# Patient Record
Sex: Male | Born: 1990 | Race: White | Hispanic: No | Marital: Single | Smoking: Current every day smoker
Health system: Southern US, Community
[De-identification: ages and names within clinical notes are randomized; demographics above are authoritative.]

## PROBLEM LIST (undated history)

## (undated) DIAGNOSIS — F988 Other specified behavioral and emotional disorders with onset usually occurring in childhood and adolescence: Secondary | ICD-10-CM

## (undated) DIAGNOSIS — G43909 Migraine, unspecified, not intractable, without status migrainosus: Secondary | ICD-10-CM

## (undated) HISTORY — PX: HERNIA REPAIR: SHX51

---
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
## 2014-04-29 ENCOUNTER — Emergency Department: Payer: Self-pay | Admitting: Emergency Medicine

## 2014-05-05 ENCOUNTER — Emergency Department: Payer: Self-pay | Admitting: Emergency Medicine

## 2014-11-23 ENCOUNTER — Emergency Department
Admit: 2014-11-23 | Disposition: A | Discharge status: Home / Self Care | Payer: Self-pay | Admitting: Emergency Medicine

## 2015-04-15 ENCOUNTER — Encounter: Payer: Self-pay | Admitting: Emergency Medicine

## 2015-04-15 ENCOUNTER — Emergency Department
Admission: EM | Admit: 2015-04-15 | Discharge: 2015-04-15 | Disposition: A | Discharge status: Home / Self Care | Payer: BLUE CROSS/BLUE SHIELD | Attending: Emergency Medicine | Admitting: Emergency Medicine

## 2015-04-15 DIAGNOSIS — Z720 Tobacco use: Secondary | ICD-10-CM | POA: Insufficient documentation

## 2015-04-15 DIAGNOSIS — F329 Major depressive disorder, single episode, unspecified: Secondary | ICD-10-CM

## 2015-04-15 DIAGNOSIS — F32A Depression, unspecified: Secondary | ICD-10-CM

## 2015-04-15 DIAGNOSIS — Z79899 Other long term (current) drug therapy: Secondary | ICD-10-CM | POA: Insufficient documentation

## 2015-04-15 DIAGNOSIS — R45851 Suicidal ideations: Secondary | ICD-10-CM | POA: Diagnosis present

## 2015-04-15 DIAGNOSIS — F909 Attention-deficit hyperactivity disorder, unspecified type: Secondary | ICD-10-CM

## 2015-04-15 DIAGNOSIS — F331 Major depressive disorder, recurrent, moderate: Secondary | ICD-10-CM

## 2015-04-15 HISTORY — DX: Other specified behavioral and emotional disorders with onset usually occurring in childhood and adolescence: F98.8

## 2015-04-15 HISTORY — DX: Migraine, unspecified, not intractable, without status migrainosus: G43.909

## 2015-04-15 LAB — COMPREHENSIVE METABOLIC PANEL
ALT: 19 U/L (ref 17–63)
AST: 27 U/L (ref 15–41)
Albumin: 4.9 g/dL (ref 3.5–5.0)
Alkaline Phosphatase: 61 U/L (ref 38–126)
Anion gap: 7 (ref 5–15)
BUN: 16 mg/dL (ref 6–20)
CHLORIDE: 102 mmol/L (ref 101–111)
CO2: 31 mmol/L (ref 22–32)
CREATININE: 1.06 mg/dL (ref 0.61–1.24)
Calcium: 9.4 mg/dL (ref 8.9–10.3)
Glucose, Bld: 114 mg/dL — ABNORMAL HIGH (ref 65–99)
POTASSIUM: 4 mmol/L (ref 3.5–5.1)
Sodium: 140 mmol/L (ref 135–145)
Total Bilirubin: 0.7 mg/dL (ref 0.3–1.2)
Total Protein: 7.7 g/dL (ref 6.5–8.1)

## 2015-04-15 LAB — CBC
HCT: 45.1 % (ref 40.0–52.0)
Hemoglobin: 15.2 g/dL (ref 13.0–18.0)
MCH: 30.7 pg (ref 26.0–34.0)
MCHC: 33.7 g/dL (ref 32.0–36.0)
MCV: 91.2 fL (ref 80.0–100.0)
PLATELETS: 203 10*3/uL (ref 150–440)
RBC: 4.94 MIL/uL (ref 4.40–5.90)
RDW: 13 % (ref 11.5–14.5)
WBC: 13.6 10*3/uL — AB (ref 3.8–10.6)

## 2015-04-15 LAB — URINE DRUG SCREEN, QUALITATIVE (ARMC ONLY)
Amphetamines, Ur Screen: POSITIVE — AB
BENZODIAZEPINE, UR SCRN: NOT DETECTED
Barbiturates, Ur Screen: NOT DETECTED
CANNABINOID 50 NG, UR ~~LOC~~: POSITIVE — AB
Cocaine Metabolite,Ur ~~LOC~~: NOT DETECTED
MDMA (ECSTASY) UR SCREEN: NOT DETECTED
Methadone Scn, Ur: NOT DETECTED
Opiate, Ur Screen: NOT DETECTED
Phencyclidine (PCP) Ur S: NOT DETECTED
TRICYCLIC, UR SCREEN: NOT DETECTED

## 2015-04-15 LAB — SALICYLATE LEVEL

## 2015-04-15 LAB — ACETAMINOPHEN LEVEL: Acetaminophen (Tylenol), Serum: <10 ug/mL — ABNORMAL LOW (ref 10–30)

## 2015-04-15 LAB — ETHANOL

## 2015-04-15 MED ORDER — CITALOPRAM HYDROBROMIDE 20 MG PO TABS: 20.0000 mg | ORAL_TABLET | Freq: Every day | ORAL | Status: DC

## 2015-04-15 NOTE — ED Notes (Signed)
ED BHU PLACEMENT JUSTIFICATION Is the patient under IVC or is there intent for IVC: Yes.   Is the patient medically cleared: Yes.   Is there vacancy in the ED BHU: Yes.   Is the population mix appropriate for patient: Yes.   Is the patient awaiting placement in inpatient or outpatient setting: Yes.   Has the patient had a psychiatric consult: No. Survey of unit performed for contraband, proper placement and condition of furniture, tampering with fixtures in bathroom, shower, and each patient room: Yes.  ; Findings: none APPEARANCE/BEHAVIOR calm, cooperative and adequate rapport can be established NEURO ASSESSMENT Orientation: time, place and person Hallucinations: No.None noted (Hallucinations) Speech: Normal Gait: normal RESPIRATORY ASSESSMENT Normal expansion.  Clear to auscultation.  No rales, rhonchi, or wheezing., No chest wall tenderness., No kyphosis or scoliosis. CARDIOVASCULAR ASSESSMENT regular rate and rhythm, S1, S2 normal, no murmur, click, rub or gallop GASTROINTESTINAL ASSESSMENT soft, nontender, BS WNL, no r/g EXTREMITIES normal strength, tone, and muscle mass PLAN OF CARE Provide calm/safe environment. Vital signs assessed twice daily. ED BHU Assessment once each 12-hour shift. Collaborate with intake RN daily or as condition indicates. Assure the ED provider has rounded once each shift. Provide and encourage hygiene. Provide redirection as needed. Assess for escalating behavior; address immediately and inform ED provider.  Assess family dynamic and appropriateness for visitation as needed: No.; If necessary, describe findings: No family available at this time Educate the patient/family about BHU procedures/visitation: No.; If necessary, describe findings: None available at this time

## 2015-04-15 NOTE — ED Notes (Signed)
BEHAVIORAL HEALTH ROUNDING Patient sleeping: No. Patient alert and oriented: yes Behavior appropriate: Yes.  ; If no, describe:  Nutrition and fluids offered: Yes  Toileting and hygiene offered: Yes  Sitter present: q 15 min checks Law enforcement present: Yes  

## 2015-04-15 NOTE — ED Notes (Signed)
.  edb

## 2015-04-15 NOTE — Consult Note (Signed)
[facility] Face-to-Face Psychiatry Consult   Reason for Consult:  Consult for this 24 year old man with history of depression and ADHD who came to the emergency room voluntarily Referring Physician:  Karma Greaser Patient Identification: Stephen Jimenez MRN:  [id number] Principal Diagnosis: Major depression Diagnosis:   Patient Active Problem List   Diagnosis Date Noted  . Major depression [F32.2] 04/15/2015  . ADHD (attention deficit hyperactivity disorder) [F90.9] 04/15/2015    Total Time spent with patient: 1 hour  Subjective:   Stephen Jimenez is a 24 y.o. male patient admitted with "mom and I were bickering".  HPI:  Information from the patient and the chart. Last night the patient was in an argument with his mother. He says he doesn't even know what the argument was about but she told him that she was kicking him out of her house. At that point he says he lost his temper and started talking about how he wished he were going to die. His girlfriend convinced him to come over here to the hospital. The patient didn't actually do anything to hurt himself he just felt very stressed out and overwhelmed. Mood is been stressed out a lot recently. He worries a great deal about money. He sleeps poorly at night. Appetite is okay. Not having any psychotic symptoms. He has no past history of suicide attempts and had not recently been thinking about killing himself. He has been off of his Celexa for about 3 months now and not getting any outpatient psychiatric treatment. Admits that he smokes marijuana on a daily basis.  Past psychiatric history: No history of prior psychiatric hospitalizations. He has had past psychiatric treatment years ago. He has been receiving Adderall from his primary care doctor for ADHD which she says helps him to focus. No history of suicide attempts in the past. Has entertained thoughts of it previously when he has been upset.  Social history: Patient and his girlfriend had been living with  the patient's mother. Mother is throwing him out of the house for some reason. Patient thinks he can probably go stay with his father were some friends. He has been eating at [employer]. Has a lot of financial problems trouble getting enough money together to live independently. Says he has a good relationship with his fiance.  Medical history: No significant ongoing medical problems  Family history: Says his mother has major depression problems and his father has alcohol problems.  Current medication Adderall extended release 20 mg in the morning and plain Adderall 10 mg in the afternoon   HPI Elements:   Quality:  Depression and agitation and suicidal thoughts. Severity:  Moderate and transient. Timing:  Last night and this morning. Duration:  Several hours but resolving. Context:  Financial stress and stress of fighting with his mother.  Past Medical History:  Past Medical History  Diagnosis Date  . Migraines   . ADD (attention deficit disorder)     Past Surgical History  Procedure Laterality Date  . Hernia repair     Family History: History reviewed. No pertinent family history. Social History:  History  Alcohol Use  . Yes    Comment: occasional     History  Drug Use  . Yes  . Special: Marijuana    Social History   Social History  . Marital Status: Single    Spouse Name: N/A  . Number of Children: N/A  . Years of Education: N/A   Social History Main Topics  . Smoking status:  Current Every Day Smoker -- 1.00 packs/day    Types: Cigarettes  . Smokeless tobacco: None  . Alcohol Use: Yes     Comment: occasional  . Drug Use: Yes    Special: Marijuana  . Sexual Activity: Not Asked   Other Topics Concern  . None   Social History Narrative  . None   Additional Social History:                          Allergies:   Allergies  Allergen Reactions  . Benadryl [Diphenhydramine Hcl (Sleep)]   . Naproxen     Labs:  Results for orders placed  or performed during the hospital encounter of 04/15/15 (from the past 48 hour(s))  Comprehensive metabolic panel     Status: Abnormal   Collection Time: 04/15/15  4:19 AM  Result Value Ref Range   Sodium 140 135 - 145 mmol/L   Potassium 4.0 3.5 - 5.1 mmol/L   Chloride 102 101 - 111 mmol/L   CO2 31 22 - 32 mmol/L   Glucose, Bld 114 (H) 65 - 99 mg/dL   BUN 16 6 - 20 mg/dL   Creatinine, Ser 1.06 0.61 - 1.24 mg/dL   Calcium 9.4 8.9 - 10.3 mg/dL   Total Protein 7.7 6.5 - 8.1 g/dL   Albumin 4.9 3.5 - 5.0 g/dL   AST 27 15 - 41 U/L   ALT 19 17 - 63 U/L   Alkaline Phosphatase 61 38 - 126 U/L   Total Bilirubin 0.7 0.3 - 1.2 mg/dL   GFR calc non Af Amer >60 >60 mL/min   GFR calc Af Amer >60 >60 mL/min    Comment: (NOTE) The eGFR has been calculated using the CKD EPI equation. This calculation has not been validated in all clinical situations. eGFR's persistently <60 mL/min signify possible Chronic Kidney Disease.    Anion gap 7 5 - 15  Ethanol (ETOH)     Status: None   Collection Time: 04/15/15  4:19 AM  Result Value Ref Range   Alcohol, Ethyl (B) <5 <5 mg/dL    Comment:        LOWEST DETECTABLE LIMIT FOR SERUM ALCOHOL IS 5 mg/dL FOR MEDICAL PURPOSES ONLY   Salicylate level     Status: None   Collection Time: 04/15/15  4:19 AM  Result Value Ref Range   Salicylate Lvl <6.9 2.8 - 30.0 mg/dL  Acetaminophen level     Status: Abnormal   Collection Time: 04/15/15  4:19 AM  Result Value Ref Range   Acetaminophen (Tylenol), Serum <10 (L) 10 - 30 ug/mL    Comment:        THERAPEUTIC CONCENTRATIONS VARY SIGNIFICANTLY. A RANGE OF 10-30 ug/mL MAY BE AN EFFECTIVE CONCENTRATION FOR MANY PATIENTS. HOWEVER, SOME ARE BEST TREATED AT CONCENTRATIONS OUTSIDE THIS RANGE. ACETAMINOPHEN CONCENTRATIONS >150 ug/mL AT 4 HOURS AFTER INGESTION AND >50 ug/mL AT 12 HOURS AFTER INGESTION ARE OFTEN ASSOCIATED WITH TOXIC REACTIONS.   CBC     Status: Abnormal   Collection Time: 04/15/15  4:19 AM   Result Value Ref Range   WBC 13.6 (H) 3.8 - 10.6 K/uL   RBC 4.94 4.40 - 5.90 MIL/uL   Hemoglobin 15.2 13.0 - 18.0 g/dL   HCT 45.1 40.0 - 52.0 %   MCV 91.2 80.0 - 100.0 fL   MCH 30.7 26.0 - 34.0 pg   MCHC 33.7 32.0 - 36.0 g/dL   RDW 13.0 11.5 -  14.5 %   Platelets 203 150 - 440 K/uL  Urine Drug Screen, Qualitative ([facility] only)     Status: Abnormal   Collection Time: 04/15/15  7:27 AM  Result Value Ref Range   Tricyclic, Ur Screen NONE DETECTED NONE DETECTED   Amphetamines, Ur Screen POSITIVE (A) NONE DETECTED   MDMA (Ecstasy)Ur Screen NONE DETECTED NONE DETECTED   Cocaine Metabolite,Ur [place] NONE DETECTED NONE DETECTED   Opiate, Ur Screen NONE DETECTED NONE DETECTED   Phencyclidine (PCP) Ur S NONE DETECTED NONE DETECTED   Cannabinoid 50 Ng, Ur [place] POSITIVE (A) NONE DETECTED   Barbiturates, Ur Screen NONE DETECTED NONE DETECTED   Benzodiazepine, Ur Scrn NONE DETECTED NONE DETECTED   Methadone Scn, Ur NONE DETECTED NONE DETECTED    Comment: (NOTE) 425  Tricyclics, urine               Cutoff 1000 ng/mL 200  Amphetamines, urine             Cutoff 1000 ng/mL 300  MDMA (Ecstasy), urine           Cutoff 500 ng/mL 400  Cocaine Metabolite, urine       Cutoff 300 ng/mL 500  Opiate, urine                   Cutoff 300 ng/mL 600  Phencyclidine (PCP), urine      Cutoff 25 ng/mL 700  Cannabinoid, urine              Cutoff 50 ng/mL 800  Barbiturates, urine             Cutoff 200 ng/mL 900  Benzodiazepine, urine           Cutoff 200 ng/mL 1000 Methadone, urine                Cutoff 300 ng/mL 1100 1200 The urine drug screen provides only a preliminary, unconfirmed 1300 analytical test result and should not be used for non-medical 1400 purposes. Clinical consideration and professional judgment should 1500 be applied to any positive drug screen result due to possible 1600 interfering substances. A more specific alternate chemical method 1700 must be used in order to obtain a confirmed analytical  result.  1800 Gas chromato graphy / mass spectrometry (GC/MS) is the preferred 1900 confirmatory method.     Vitals: Blood pressure 109/67, pulse 92, temperature 97.9 F (36.6 C), temperature source Oral, resp. rate 16, height 5' 11"  (1.803 m), weight 58.968 kg (130 lb), SpO2 100 %.  Risk to Self: Is patient at risk for suicide?: No, but patient needs Medical Clearance Risk to Others:   Prior Inpatient Therapy:   Prior Outpatient Therapy:    No current facility-administered medications for this encounter.   Current Outpatient Prescriptions  Medication Sig Dispense Refill  . amphetamine-dextroamphetamine (ADDERALL XR) 20 MG 24 hr capsule Take 20 mg by mouth daily.    Marland Kitchen amphetamine-dextroamphetamine (ADDERALL) 10 MG tablet Take 10 mg by mouth daily with breakfast.    . citalopram (CELEXA) 20 MG tablet Take 1 tablet (20 mg total) by mouth daily. 30 tablet 0    Musculoskeletal: Strength & Muscle Tone: within normal limits Gait & Station: normal Patient leans: N/A  Psychiatric Specialty Exam: Physical Exam  Nursing note and vitals reviewed. Constitutional: He appears well-developed and well-nourished.  HENT:  Head: Normocephalic and atraumatic.  Eyes: Conjunctivae are normal. Pupils are equal, round, and reactive to light.  Neck: Normal range of motion.  Cardiovascular: Normal heart sounds.   Respiratory: Effort normal.  GI: Soft.  Musculoskeletal: Normal range of motion.  Neurological: He is alert.  Skin: Skin is warm and dry.  Psychiatric: His speech is normal and behavior is normal. Judgment and thought content normal. His mood appears anxious. Cognition and memory are normal.    Review of Systems  Constitutional: Negative.   HENT: Negative.   Eyes: Negative.   Respiratory: Negative.   Cardiovascular: Negative.   Gastrointestinal: Negative.   Musculoskeletal: Negative.   Skin: Negative.   Neurological: Negative.   Psychiatric/Behavioral: Positive for depression  and substance abuse. Negative for suicidal ideas and hallucinations. The patient is not nervous/anxious and does not have insomnia.     Blood pressure 109/67, pulse 92, temperature 97.9 F (36.6 C), temperature source Oral, resp. rate 16, height 5' 11"  (1.803 m), weight 58.968 kg (130 lb), SpO2 100 %.Body mass index is 18.14 kg/(m^2).  General Appearance: Casual  Eye Contact::  Fair  Speech:  Normal Rate  Volume:  Normal  Mood:  Depressed  Affect:  Depressed  Thought Process:  Linear  Orientation:  Full (Time, Place, and Person)  Thought Content:  Negative  Suicidal Thoughts:  No  Homicidal Thoughts:  No  Memory:  Immediate;   Fair Recent;   Fair Remote;   Fair  Judgement:  Intact  Insight:  Fair  Psychomotor Activity:  Normal  Concentration:  Fair  Recall:  [employer] of Knowledge:Good  Language: Good  Akathisia:  No  Handed:  Right  AIMS (if indicated):     Assets:  Communication Skills Desire for Improvement Physical Health  ADL's:  Intact  Cognition: WNL  Sleep:      Medical Decision Making: Review of Psycho-Social Stressors (1), Review or order clinical lab tests (1), Review or order medicine tests (1) and Review of Medication Regimen & Side Effects (2)  Treatment Plan Summary: Medication management and Plan Patient will be started back on citalopram 20 mg a day. Supportive and educational counseling completed. Patient is no longer reporting suicidal ideation and has a positive plan for the future. Agrees to follow up with [facility]. Case discussed with emergency room attending. Patient is encouraged to discontinue his regular use of marijuana and to avoid alcohol. He can be released from the emergency room and will follow-up as an outpatient.  Plan:  Patient does not meet criteria for psychiatric inpatient admission. Supportive therapy provided about ongoing stressors. Disposition: Follow up as an outpatient after discontinuing the involuntary commitment  Alethia Berthold 04/15/2015 6:33 PM

## 2015-04-15 NOTE — ED Notes (Signed)
BEHAVIORAL HEALTH ROUNDING Patient sleeping: No. Patient alert and oriented: yes Behavior appropriate: Yes.   Nutrition and fluids offered: Yes  Toileting and hygiene offered: Yes  Sitter present: q15 min observations Law enforcement present: Yes [employer] 

## 2015-04-15 NOTE — ED Notes (Signed)
Patient verbalized understanding of need to return to ED with any thoughts of hurting himself or anyone else.

## 2015-04-15 NOTE — ED Notes (Signed)
Patient assigned to appropriate care area. Patient oriented to unit/care area: Informed that, for their safety, care areas are designed for safety and monitored by security cameras at all times; and visiting hours explained to patient. Patient verbalizes understanding, and verbal contract for safety obtained. 

## 2015-04-15 NOTE — ED Notes (Signed)

## 2015-04-15 NOTE — ED Provider Notes (Signed)
-----------------------------------------   6:23 PM on 04/15/2015 -----------------------------------------   BP 103/77 mmHg  Pulse 80  Temp(Src) 98.3 F (36.8 C) (Oral)  Resp 18  Ht  (1.803 m)  Wt 130 lb (58.968 kg)  BMI 18.14 kg/m2  SpO2 98%  I spoke in person with Dr. Toni Amend who evaluated the patient and feels that the patient is safe to be discharged with outpatient follow-up.  The patient is hemodynamically stable and appropriate to go at this time.   Loleta Rose, MD 04/15/15 [phone]

## 2015-04-15 NOTE — ED Notes (Signed)
BEHAVIORAL HEALTH ROUNDING Patient sleeping: Yes.   Patient alert and oriented: sleeping Behavior appropriate: Yes.  ; If no, describe:  Nutrition and fluids offered: Yes  Toileting and hygiene offered: Yes  Sitter present: q 15 min checks Law enforcement present: Yes  

## 2015-04-15 NOTE — ED Provider Notes (Signed)
[facility] Emergency Department Provider Note  ____________________________________________  Time seen: 5:30 AM  I have reviewed the triage vital signs and the nursing notes.   HISTORY  Chief Complaint Mental Health Problem     HPI Stephen Jimenez is a 24 y.o. male presents with suicidal ideation 2 days. Patient also voices feelings of depression. Patient denies any previous suicide attempts or psychiatric hospitalizations. Patient states that he is being having verbal altercations with his mother with whom he lives.     Past Medical History  Diagnosis Date  . Migraines   . ADD (attention deficit disorder)     There are no active problems to display for this patient.   Past Surgical History  Procedure Laterality Date  . Hernia repair      Current Outpatient Rx  Name  Route  Sig  Dispense  Refill  . amphetamine-dextroamphetamine (ADDERALL XR) 20 MG 24 hr capsule   Oral   Take 20 mg by mouth daily.         Marland Kitchen amphetamine-dextroamphetamine (ADDERALL) 10 MG tablet   Oral   Take 10 mg by mouth daily with breakfast.           Allergies Benadryl and Naproxen  History reviewed. No pertinent family history.  Social History Social History  Substance Use Topics  . Smoking status: Current Every Day Smoker -- 1.00 packs/day    Types: Cigarettes  . Smokeless tobacco: None  . Alcohol Use: Yes     Comment: occasional    Review of Systems  Constitutional: Negative for fever. Eyes: Negative for visual changes. ENT: Negative for sore throat. Cardiovascular: Negative for chest pain. Respiratory: Negative for shortness of breath. Gastrointestinal: Negative for abdominal pain, vomiting and diarrhea. Genitourinary: Negative for dysuria. Musculoskeletal: Negative for back pain. Skin: Negative for rash. Neurological: Negative for headaches, focal weakness or numbness. Psychiatric: Depression and suicidal ideation  10-point ROS otherwise  negative.  ____________________________________________   PHYSICAL EXAM:  VITAL SIGNS: ED Triage Vitals  Enc Vitals Group     BP 04/15/15 0412 135/83 mmHg     Pulse Rate 04/15/15 0412 95     Resp 04/15/15 0412 20     Temp 04/15/15 0412 98 F (36.7 C)     Temp Source 04/15/15 0412 Oral     SpO2 04/15/15 0412 98 %     Weight 04/15/15 0412 130 lb (58.968 kg)     Height 04/15/15 0412 5\' 11"  (1.803 m)     Head Cir --      Peak Flow --      Pain Score --      Pain Loc --      Pain Edu? --      Excl. in GC? --      Constitutional: Alert and oriented. Well appearing and in no distress. Eyes: Conjunctivae are normal. PERRL. Normal extraocular movements. ENT   Head: Normocephalic and atraumatic.   Nose: No congestion/rhinnorhea.   Mouth/Throat: Mucous membranes are moist.   Neck: No stridor. Hematological/Lymphatic/Immunilogical: No cervical lymphadenopathy. Cardiovascular: Normal rate, regular rhythm. Normal and symmetric distal pulses are present in all extremities. No murmurs, rubs, or gallops. Respiratory: Normal respiratory effort without tachypnea nor retractions. Breath sounds are clear and equal bilaterally. No wheezes/rales/rhonchi. Gastrointestinal: Soft and nontender. No distention. There is no CVA tenderness. Genitourinary: deferred Musculoskeletal: Nontender with normal range of motion in all extremities. No joint effusions.  No lower extremity tenderness nor edema. Neurologic:  Normal speech and  language. No gross focal neurologic deficits are appreciated. Speech is normal.  Skin:  Skin is warm, dry and intact. No rash noted. Psychiatric: Mood and affect are normal. Speech and behavior are normal. Patient exhibits appropriate insight and judgment.  ____________________________________________    LABS (pertinent positives/negatives)  Labs Reviewed  COMPREHENSIVE METABOLIC PANEL - Abnormal; Notable for the following:    Glucose, Bld 114 (*)    All  other components within normal limits  ACETAMINOPHEN LEVEL - Abnormal; Notable for the following:    Acetaminophen (Tylenol), Serum <10 (*)    All other components within normal limits  CBC - Abnormal; Notable for the following:    WBC 13.6 (*)    All other components within normal limits  ETHANOL  SALICYLATE LEVEL  URINE DRUG SCREEN, QUALITATIVE ([facility] ONLY)          INITIAL IMPRESSION / ASSESSMENT AND PLAN / ED COURSE  Pertinent labs & imaging results that were available during my care of the patient were reviewed by me and considered in my medical decision making (see chart for details).  Given suicidal ideation will involuntary commit the patient while he awaits psychiatric evaluation.  ____________________________________________   FINAL CLINICAL IMPRESSION(S) / ED DIAGNOSES  Final diagnoses:  Depression      Darci Current, MD 04/17/15 [phone]

## 2015-04-15 NOTE — ED Provider Notes (Signed)
-----------------------------------------   10:46 AM on 04/15/2015 -----------------------------------------   Blood pressure 103/77, pulse 80, temperature 98.3 F (36.8 C), temperature source Oral, resp. rate 18, height  (1.803 m), weight 130 lb (58.968 kg), SpO2 98 %.  The patient had no acute events since last update.  Calm and cooperative at this time.  Disposition is pending per Psychiatry/Behavioral Medicine team recommendations.     Arnaldo Natal, MD 04/15/15 [phone]

## 2015-04-15 NOTE — ED Notes (Signed)
BEHAVIORAL HEALTH ROUNDING Patient sleeping: No. Patient alert and oriented: yes Behavior appropriate: Yes.  ; If no, describe:  Nutrition and fluids offered: Yes  Toileting and hygiene offered: Yes  Sitter present: yes Law enforcement present: Yes  

## 2015-04-15 NOTE — ED Notes (Signed)

## 2015-04-15 NOTE — ED Notes (Addendum)
Pt reports coming to the ER voluntarily because "I have been having thoughts about committing suicide". When asked about a specific plan pt stated " I know there are multiple ways, but I don'y have any way decided".  Pt reports having moved into Mother's home with fiance about 6 months ago. Pt reports that lately the srtess has gotten "really bad, and all my Mom and I do is fight all the time". Pt denies fighting with fiance. Pt does report infrequent ETOH use, and almost daily marijuana use. Pt denies HI, A/V H. Pt reports thinking about suicide previously, but has "not attempted" it before.  Pt reports hx of ADD with medication of Adderall  total.

## 2015-04-15 NOTE — Discharge Instructions (Signed)
You have been seen in the Emergency Department (ED) today for a psychiatric complaint.  You have been evaluated by psychiatry and we believe you are safe to be discharged from the hospital.   ° °Please return to the ED immediately if you have ANY thoughts of hurting yourself or anyone else, so that we may help you. ° °Please avoid alcohol and drug use. ° °Follow up with your doctor and/or therapist as soon as possible regarding today's ED visit.   Please follow up any other recommendations and clinic appointments provided by the psychiatry team that saw you in the Emergency Department. ° ° °Depression °Depression refers to feeling sad, low, down in the dumps, blue, gloomy, or empty. In general, there are two kinds of depression: °1. Normal sadness or normal grief. This kind of depression is one that we all feel from time to time after upsetting life experiences, such as the loss of a job or the ending of a relationship. This kind of depression is considered normal, is short lived, and resolves within a few days to 2 weeks. Depression experienced after the loss of a loved one (bereavement) often lasts longer than 2 weeks but normally gets better with time. °2. Clinical depression. This kind of depression lasts longer than normal sadness or normal grief or interferes with your ability to function at home, at work, and in school. It also interferes with your personal relationships. It affects almost every aspect of your life. Clinical depression is an illness. °Symptoms of depression can also be caused by conditions other than those mentioned above, such as: °· Physical illness. Some physical illnesses, including underactive thyroid gland (hypothyroidism), severe anemia, specific types of cancer, diabetes, uncontrolled seizures, heart and lung problems, strokes, and chronic pain are commonly associated with symptoms of depression. °· Side effects of some prescription medicine. In some people, certain types of medicine  can cause symptoms of depression. °· Substance abuse. Abuse of alcohol and illicit drugs can cause symptoms of depression. °SYMPTOMS °Symptoms of normal sadness and normal grief include the following: °· Feeling sad or crying for short periods of time. °· Not caring about anything (apathy). °· Difficulty sleeping or sleeping too much. °· No longer able to enjoy the things you used to enjoy. °· Desire to be by oneself all the time (social isolation). °· Lack of energy or motivation. °· Difficulty concentrating or remembering. °· Change in appetite or weight. °· Restlessness or agitation. °Symptoms of clinical depression include the same symptoms of normal sadness or normal grief and also the following symptoms: °· Feeling sad or crying all the time. °· Feelings of guilt or worthlessness. °· Feelings of hopelessness or helplessness. °· Thoughts of suicide or the desire to harm yourself (suicidal ideation). °· Loss of touch with reality (psychotic symptoms). Seeing or hearing things that are not real (hallucinations) or having false beliefs about your life or the people around you (delusions and paranoia). °DIAGNOSIS  °The diagnosis of clinical depression is usually based on how bad the symptoms are and how [place] they have lasted. Your health care provider will also ask you questions about your medical history and substance use to find out if physical illness, use of prescription medicine, or substance abuse is causing your depression. Your health care provider may also order blood tests. °TREATMENT  °Often, normal sadness and normal grief do not require treatment. However, sometimes antidepressant medicine is given for bereavement to ease the depressive symptoms until they resolve. °The treatment for clinical depression   depends on how bad the symptoms are but often includes antidepressant medicine, counseling with a mental health professional, or both. Your health care provider will help to determine what treatment is  best for you. °Depression caused by physical illness usually goes away with appropriate medical treatment of the illness. If prescription medicine is causing depression, talk with your health care provider about stopping the medicine, decreasing the dose, or changing to another medicine. °Depression caused by the abuse of alcohol or illicit drugs goes away when you stop using these substances. Some adults need professional help in order to stop drinking or using drugs. °SEEK IMMEDIATE MEDICAL CARE IF: °· You have thoughts about hurting yourself or others. °· You lose touch with reality (have psychotic symptoms). °· You are taking medicine for depression and have a serious side effect. °FOR MORE INFORMATION °· National Alliance on Mental Illness: www.nami.org  °· National Institute of Mental Health: www.nimh.nih.gov  °Document Released: 07/29/2000 Document Revised: 12/16/2013 Document Reviewed: 10/31/2011 °ExitCare® Patient Information ©2015 [employer]. This information is not intended to replace advice given to you by your health care provider. Make sure you discuss any questions you have with your health care provider. ° °

## 2015-04-15 NOTE — ED Notes (Signed)
Pt given supper tray.

## 2015-04-15 NOTE — ED Notes (Signed)
Patient ambulatory to triage with steady gait, without difficulty, pt tearful, states, "I really wanted to kill myself tonight, a lot of stress"; st was kicked of his mother's house tonight and has nowhere to go; pt reports hx depression but not on any meds since 2009

## 2016-09-24 ENCOUNTER — Emergency Department
Admission: EM | Admit: 2016-09-24 | Discharge: 2016-09-24 | Disposition: A | Discharge status: Home / Self Care | Payer: BLUE CROSS/BLUE SHIELD | Attending: Emergency Medicine | Admitting: Emergency Medicine

## 2016-09-24 ENCOUNTER — Encounter: Payer: Self-pay | Admitting: Emergency Medicine

## 2016-09-24 DIAGNOSIS — J111 Influenza due to unidentified influenza virus with other respiratory manifestations: Secondary | ICD-10-CM | POA: Insufficient documentation

## 2016-09-24 DIAGNOSIS — F17210 Nicotine dependence, cigarettes, uncomplicated: Secondary | ICD-10-CM | POA: Diagnosis not present

## 2016-09-24 DIAGNOSIS — F909 Attention-deficit hyperactivity disorder, unspecified type: Secondary | ICD-10-CM | POA: Insufficient documentation

## 2016-09-24 DIAGNOSIS — R509 Fever, unspecified: Secondary | ICD-10-CM | POA: Diagnosis present

## 2016-09-24 NOTE — Discharge Instructions (Signed)
Your symptoms are consistent with influenza. Continue to monitor and treat symptoms including fevers, cough, and congestion. Drink plenty of fluids to prevent dehydration. Follow-up with your provider or return to the ED for continued symptoms.

## 2016-09-24 NOTE — ED Provider Notes (Signed)
[facility] Emergency Department Provider Note ____________________________________________  Time seen: 1739  I have reviewed the triage vital signs and the nursing notes.  HISTORY  Chief Complaint  Cough; Nasal Congestion; and Fever  HPI Stephen Jimenez is a 26 y.o. male resistance to the ED for evaluation of flulike symptoms for the last 3 days. Patient describes his mom was diagnosed today with influenza and pneumonia. He did not receive seasonal flu vaccine. Has been sick however for 3 days prior to the fever onset. He has been taking Zofran for nausea, and he is not incontinent take any prescribed medications for influenza.He presents here to the ED at the encouragement of his mother.   Past Medical History:  Diagnosis Date  . ADD (attention deficit disorder)   . Migraines     Patient Active Problem List   Diagnosis Date Noted  . Major depression 04/15/2015  . ADHD (attention deficit hyperactivity disorder) 04/15/2015    Past Surgical History:  Procedure Laterality Date  . HERNIA REPAIR      Prior to Admission medications   Medication Sig Start Date End Date Taking? Authorizing Provider  amphetamine-dextroamphetamine (ADDERALL XR) 20 MG 24 hr capsule Take 20 mg by mouth daily.    Historical Provider, MD  amphetamine-dextroamphetamine (ADDERALL) 10 MG tablet Take 10 mg by mouth daily with breakfast.    Historical Provider, MD  citalopram (CELEXA) 20 MG tablet Take 1 tablet (20 mg total) by mouth daily. 04/15/15   Audery AmelJohn T Clapacs, MD    Allergies Benadryl [diphenhydramine hcl (sleep)] and Naproxen  History reviewed. No pertinent family history.  Social History Social History  Substance Use Topics  . Smoking status: Current Every Day Smoker    Packs/day: 1.00    Types: Cigarettes  . Smokeless tobacco: Never Used  . Alcohol use Yes     Comment: occasional    Review of Systems  Constitutional: Positive for fever. Eyes: Negative for visual  changes. ENT: Negative for sore throat. Cardiovascular: Negative for chest pain. Respiratory: Negative for shortness of breath. Gastrointestinal: Negative for abdominal pain, vomiting and diarrhea. Reports nausea.  Genitourinary: Negative for dysuria. Musculoskeletal: Negative for back pain. Skin: Negative for rash. Neurological: Negative for headaches, focal weakness or numbness. ____________________________________________  PHYSICAL EXAM:  VITAL SIGNS: ED Triage Vitals  Enc Vitals Group     BP 09/24/16 1555 124/78     Pulse Rate 09/24/16 1555 71     Resp 09/24/16 1555 20     Temp 09/24/16 1555 98.6 F (37 C)     Temp Source 09/24/16 1555 Oral     SpO2 09/24/16 1555 100 %     Weight 09/24/16 1555 130 lb (59 kg)     Height --      Head Circumference --      Peak Flow --      Pain Score 09/24/16 1556 4     Pain Loc --      Pain Edu? --      Excl. in GC? --    Constitutional: Alert and oriented. Well appearing and in no distress. Head: Normocephalic and atraumatic. Eyes: Conjunctivae are normal. PERRL. Normal extraocular movements Ears: Canals clear. TMs intact, injected bilaterally. Nose: No congestion/rhinorrhea/epistaxis. Mouth/Throat: Mucous membranes are moist. Neck: Supple. No thyromegaly. Hematological/Lymphatic/Immunological: No cervical lymphadenopathy. Cardiovascular: Normal rate, regular rhythm. Normal distal pulses. Respiratory: Normal respiratory effort. No wheezes/rales/rhonchi. Gastrointestinal: Soft and nontender. No distention. Musculoskeletal: Nontender with normal range of motion in all extremities.  Neurologic:  Normal gait without ataxia. Normal speech and language. No gross focal neurologic deficits are appreciated. Skin:  Skin is warm, dry and intact. No rash noted. ____________________________________________  INITIAL IMPRESSION / ASSESSMENT AND PLAN / ED COURSE  Patient with influenza-like illness and recent influenza exposure. He is discharged  with instructions to continue to monitor and treat fevers as appropriate. He may dose over-the-counter Tylenol, Delsym, or pseudoephedrine for symptom relief. He is to continue dosing Zofran to prevent nausea and encourage fluid hydration. He should follow with his primary care provider or return to the ED as needed. ____________________________________________  FINAL CLINICAL IMPRESSION(S) / ED DIAGNOSES  Final diagnoses:  Influenza      Lissa Hoard, PA-C 09/24/16 1855    Loleta Rose, MD 09/24/16 2036

## 2016-09-24 NOTE — ED Triage Notes (Signed)
Pt to ed with c/o cough, congestion, and fever x 3 days.

## 2016-09-24 NOTE — ED Notes (Signed)
Pt reports flu like symptom since Thursday been taking zofran, gatorade reports n/v reports with zofran able o keep fluids reports today feeling better reports mother diagnosed with flu reason why pt came per mother concern.

## 2016-09-24 NOTE — ED Notes (Signed)
Pt verbalizes understanding of discharge instruction  

## 2017-02-20 ENCOUNTER — Emergency Department: Payer: BLUE CROSS/BLUE SHIELD

## 2017-02-20 ENCOUNTER — Emergency Department
Admission: EM | Admit: 2017-02-20 | Discharge: 2017-02-20 | Disposition: A | Discharge status: Home / Self Care | Payer: BLUE CROSS/BLUE SHIELD | Attending: Emergency Medicine | Admitting: Emergency Medicine

## 2017-02-20 ENCOUNTER — Encounter: Payer: Self-pay | Admitting: Emergency Medicine

## 2017-02-20 DIAGNOSIS — Z79899 Other long term (current) drug therapy: Secondary | ICD-10-CM | POA: Insufficient documentation

## 2017-02-20 DIAGNOSIS — F17210 Nicotine dependence, cigarettes, uncomplicated: Secondary | ICD-10-CM | POA: Insufficient documentation

## 2017-02-20 DIAGNOSIS — R1031 Right lower quadrant pain: Secondary | ICD-10-CM | POA: Diagnosis not present

## 2017-02-20 LAB — COMPREHENSIVE METABOLIC PANEL
ALT: 15 U/L — AB (ref 17–63)
AST: 24 U/L (ref 15–41)
Albumin: 4.7 g/dL (ref 3.5–5.0)
Alkaline Phosphatase: 54 U/L (ref 38–126)
Anion gap: 8 (ref 5–15)
BILIRUBIN TOTAL: 0.8 mg/dL (ref 0.3–1.2)
BUN: 18 mg/dL (ref 6–20)
CO2: 30 mmol/L (ref 22–32)
Calcium: 9.4 mg/dL (ref 8.9–10.3)
Chloride: 102 mmol/L (ref 101–111)
Creatinine, Ser: 0.87 mg/dL (ref 0.61–1.24)
Glucose, Bld: 119 mg/dL — ABNORMAL HIGH (ref 65–99)
Potassium: 4.4 mmol/L (ref 3.5–5.1)
Sodium: 140 mmol/L (ref 135–145)
TOTAL PROTEIN: 7.4 g/dL (ref 6.5–8.1)

## 2017-02-20 LAB — LIPASE, BLOOD: LIPASE: 28 U/L (ref 11–51)

## 2017-02-20 LAB — URINALYSIS, COMPLETE (UACMP) WITH MICROSCOPIC
BILIRUBIN URINE: NEGATIVE
Bacteria, UA: NONE SEEN
GLUCOSE, UA: NEGATIVE mg/dL
HGB URINE DIPSTICK: NEGATIVE
KETONES UR: NEGATIVE mg/dL
LEUKOCYTES UA: NEGATIVE
NITRITE: NEGATIVE
Protein, ur: NEGATIVE mg/dL
RBC / HPF: NONE SEEN RBC/hpf (ref 0–5)
Specific Gravity, Urine: 1.009 (ref 1.005–1.030)
Squamous Epithelial / LPF: NONE SEEN
WBC, UA: NONE SEEN WBC/hpf (ref 0–5)
pH: 7 (ref 5.0–8.0)

## 2017-02-20 LAB — CBC
HCT: 45.4 % (ref 40.0–52.0)
Hemoglobin: 15.9 g/dL (ref 13.0–18.0)
MCH: 32 pg (ref 26.0–34.0)
MCHC: 35 g/dL (ref 32.0–36.0)
MCV: 91.5 fL (ref 80.0–100.0)
PLATELETS: 203 10*3/uL (ref 150–440)
RBC: 4.96 MIL/uL (ref 4.40–5.90)
RDW: 13.1 % (ref 11.5–14.5)
WBC: 9.6 10*3/uL (ref 3.8–10.6)

## 2017-02-20 MED ORDER — IOPAMIDOL (ISOVUE-300) INJECTION 61%
30.0000 mL | Freq: Once | INTRAVENOUS | Status: AC | PRN
  Administered 2017-02-20: 30 mL via ORAL
  Filled 2017-02-20: qty 30

## 2017-02-20 MED ORDER — SODIUM CHLORIDE 0.9 % IV BOLUS (SEPSIS)
1000.0000 mL | Freq: Once | INTRAVENOUS | Status: AC
  Administered 2017-02-20: 1000 mL via INTRAVENOUS

## 2017-02-20 MED ORDER — IOPAMIDOL (ISOVUE-300) INJECTION 61%
100.0000 mL | Freq: Once | INTRAVENOUS | Status: AC | PRN
  Administered 2017-02-20: 100 mL via INTRAVENOUS
  Filled 2017-02-20: qty 100

## 2017-02-20 MED ORDER — ONDANSETRON 4 MG PO TBDP
4.0000 mg | ORAL_TABLET | Freq: Once | ORAL | Status: AC
  Administered 2017-02-20: 4 mg via ORAL
  Filled 2017-02-20: qty 1

## 2017-02-20 MED ORDER — MORPHINE SULFATE (PF) 4 MG/ML IV SOLN
4.0000 mg | Freq: Once | INTRAVENOUS | Status: AC
  Administered 2017-02-20: 4 mg via INTRAVENOUS
  Filled 2017-02-20: qty 1

## 2017-02-20 NOTE — ED Triage Notes (Signed)
C/O RLQ abdominal pain, nausea, diarrhea since Saturday.

## 2017-02-20 NOTE — Discharge Instructions (Signed)
Results for orders placed or performed during the hospital encounter of 02/20/17  Lipase, blood  Result Value Ref Range   Lipase 28 11 - 51 U/L  Comprehensive metabolic panel  Result Value Ref Range   Sodium 140 135 - 145 mmol/L   Potassium 4.4 3.5 - 5.1 mmol/L   Chloride 102 101 - 111 mmol/L   CO2 30 22 - 32 mmol/L   Glucose, Bld 119 (H) 65 - 99 mg/dL   BUN 18 6 - 20 mg/dL   Creatinine, Ser 1.61 0.61 - 1.24 mg/dL   Calcium 9.4 8.9 - 09.6 mg/dL   Total Protein 7.4 6.5 - 8.1 g/dL   Albumin 4.7 3.5 - 5.0 g/dL   AST 24 15 - 41 U/L   ALT 15 (L) 17 - 63 U/L   Alkaline Phosphatase 54 38 - 126 U/L   Total Bilirubin 0.8 0.3 - 1.2 mg/dL   GFR calc non Af Amer >60 >60 mL/min   GFR calc Af Amer >60 >60 mL/min   Anion gap 8 5 - 15  CBC  Result Value Ref Range   WBC 9.6 3.8 - 10.6 K/uL   RBC 4.96 4.40 - 5.90 MIL/uL   Hemoglobin 15.9 13.0 - 18.0 g/dL   HCT 04.5 40.9 - 81.1 %   MCV 91.5 80.0 - 100.0 fL   MCH 32.0 26.0 - 34.0 pg   MCHC 35.0 32.0 - 36.0 g/dL   RDW 91.4 78.2 - 95.6 %   Platelets 203 150 - 440 K/uL  Urinalysis, Complete w Microscopic  Result Value Ref Range   Color, Urine STRAW (A) YELLOW   APPearance CLEAR (A) CLEAR   Specific Gravity, Urine 1.009 1.005 - 1.030   pH 7.0 5.0 - 8.0   Glucose, UA NEGATIVE NEGATIVE mg/dL   Hgb urine dipstick NEGATIVE NEGATIVE   Bilirubin Urine NEGATIVE NEGATIVE   Ketones, ur NEGATIVE NEGATIVE mg/dL   Protein, ur NEGATIVE NEGATIVE mg/dL   Nitrite NEGATIVE NEGATIVE   Leukocytes, UA NEGATIVE NEGATIVE   RBC / HPF NONE SEEN 0 - 5 RBC/hpf   WBC, UA NONE SEEN 0 - 5 WBC/hpf   Bacteria, UA NONE SEEN NONE SEEN   Squamous Epithelial / LPF NONE SEEN NONE SEEN   Ct Abdomen Pelvis W Contrast  Result Date: 02/20/2017 CLINICAL DATA:  Right lower quadrant pain and tenderness for 2 days. Nausea and diarrhea. EXAM: CT ABDOMEN AND PELVIS WITH CONTRAST TECHNIQUE: Multidetector CT imaging of the abdomen and pelvis was performed using the standard protocol  following bolus administration of intravenous contrast. CONTRAST:  ISOVUE-300 IOPAMIDOL (ISOVUE-300) INJECTION 61% COMPARISON:  CT 09/16/2004 FINDINGS: Lower chest: The lung bases are clear. Hepatobiliary: No focal liver abnormality is seen. No gallstones, gallbladder wall thickening, or biliary dilatation. Pancreas: No ductal dilatation or inflammation. Spleen: Normal in size without focal abnormality. Adrenals/Urinary Tract: Normal adrenal glands. Kidneys appear normal. No hydronephrosis or perinephric edema. Ureters are decompressed. Urinary bladder is physiologically distended without wall thickening. Stomach/Bowel: Normal air-filled appendix without periappendiceal inflammation. The stomach, small and large bowel appear normal. Small to moderate stool burden without colonic wall thickening. No liquid stool. Vascular/Lymphatic: No significant vascular findings are present. No enlarged abdominal or pelvic lymph nodes. Reproductive: Prostate is unremarkable. Other: No free air, free fluid, or intra-abdominal fluid collection. Musculoskeletal: There are no acute or suspicious osseous abnormalities. IMPRESSION: Unremarkable CT of the abdomen/pelvis, no explanation for right lower quadrant pain. Specifically, normal appendix. Electronically Signed   By: Rubye Oaks  M.D.   On: 02/20/2017 16:21

## 2017-02-20 NOTE — ED Provider Notes (Signed)
[facility] Emergency Department Provider Note  ____________________________________________  Time seen: Approximately 3:54 PM  I have reviewed the triage vital signs and the nursing notes.   HISTORY  Chief Complaint Abdominal Pain    HPI Stephen Jimenez is a 26 y.o. male who complains of gradual onset right lower quadrant abdominal pain for the past 2 days. Pain is constant, worsening, worse with movement no alleviating factors. Associated nausea. Had some diarrhea yesterday which has now resolved. Pain not rated as moderate to severe in intensity. Nonradiating.     Past Medical History:  Diagnosis Date  . ADD (attention deficit disorder)   . Migraines      Patient Active Problem List   Diagnosis Date Noted  . Major depression 04/15/2015  . ADHD (attention deficit hyperactivity disorder) 04/15/2015     Past Surgical History:  Procedure Laterality Date  . HERNIA REPAIR       Prior to Admission medications   Medication Sig Start Date End Date Taking? Authorizing Provider  amphetamine-dextroamphetamine (ADDERALL XR) 20 MG 24 hr capsule Take 20 mg by mouth daily.    [provider]  amphetamine-dextroamphetamine (ADDERALL) 10 MG tablet Take 10 mg by mouth daily with breakfast.    [provider]  citalopram (CELEXA) 20 MG tablet Take 1 tablet (20 mg total) by mouth daily. 04/15/15   Clapacs, Jackquline DenmarkJohn T, MD     Allergies Benadryl [diphenhydramine hcl (sleep)] and Naproxen   No family history on file.  Social History Social History  Substance Use Topics  . Smoking status: Current Every Day Smoker    Packs/day: 1.00    Types: Cigarettes  . Smokeless tobacco: Never Used  . Alcohol use Yes     Comment: occasional    Review of Systems  Constitutional:   No fever or chills.  ENT:   No sore throat. No rhinorrhea. Cardiovascular:   No chest pain or syncope. Respiratory:   No dyspnea or cough. Gastrointestinal:   Positive  as above for abdominal pain without vomiting and diarrhea.  Musculoskeletal:   Negative for focal pain or swelling All other systems reviewed and are negative except as documented above in ROS and HPI.  ____________________________________________   PHYSICAL EXAM:  VITAL SIGNS: ED Triage Vitals  Enc Vitals Group     BP 02/20/17 1409 134/82     Pulse Rate 02/20/17 1409 66     Resp 02/20/17 1409 16     Temp 02/20/17 1409 98.6 F (37 C)     Temp Source 02/20/17 1409 Oral     SpO2 02/20/17 1409 99 %     Weight 02/20/17 1407 128 lb (58.1 kg)     Height 02/20/17 1407 5\' 11"  (1.803 m)     Head Circumference --      Peak Flow --      Pain Score 02/20/17 1407 6     Pain Loc --      Pain Edu? --      Excl. in GC? --     Vital signs reviewed, nursing assessments reviewed.   Constitutional:   Alert and oriented. Well appearing and in no distress. Eyes:   No scleral icterus.  EOMI. No nystagmus. No conjunctival pallor. PERRL. ENT   Head:   Normocephalic and atraumatic.   Nose:   No congestion/rhinnorhea.    Mouth/Throat:   MMM, no pharyngeal erythema. No peritonsillar mass.    Neck:   No meningismus. Full ROM Hematological/Lymphatic/Immunilogical:   No  cervical lymphadenopathy. Cardiovascular:   RRR. Symmetric bilateral radial and DP pulses.  No murmurs.  Respiratory:   Normal respiratory effort without tachypnea/retractions. Breath sounds are clear and equal bilaterally. No wheezes/rales/rhonchi. Gastrointestinal:   Soft With focal tenderness in the area of McBurney's point. Negative Rovsing sign.. Non distended. There is no CVA tenderness.  No rebound, rigidity, or guarding. Genitourinary:   deferred Musculoskeletal:   Normal range of motion in all extremities. No joint effusions.  No lower extremity tenderness.  No edema. Neurologic:   Normal speech and language.  Motor grossly intact. No gross focal neurologic deficits are appreciated.  Skin:    Skin is warm, dry  and intact. No rash noted.  No petechiae, purpura, or bullae.  ____________________________________________    LABS (pertinent positives/negatives) (all labs ordered are listed, but only abnormal results are displayed) Labs Reviewed  COMPREHENSIVE METABOLIC PANEL - Abnormal; Notable for the following:       Result Value   Glucose, Bld 119 (*)    ALT 15 (*)    All other components within normal limits  URINALYSIS, COMPLETE (UACMP) WITH MICROSCOPIC - Abnormal; Notable for the following:    Color, Urine STRAW (*)    APPearance CLEAR (*)    All other components within normal limits  LIPASE, BLOOD  CBC   ____________________________________________   EKG    ____________________________________________    RADIOLOGY  Ct Abdomen Pelvis W Contrast  Result Date: 02/20/2017 CLINICAL DATA:  Right lower quadrant pain and tenderness for 2 days. Nausea and diarrhea. EXAM: CT ABDOMEN AND PELVIS WITH CONTRAST TECHNIQUE: Multidetector CT imaging of the abdomen and pelvis was performed using the standard protocol following bolus administration of intravenous contrast. CONTRAST:  ISOVUE-300 IOPAMIDOL (ISOVUE-300) INJECTION 61% COMPARISON:  CT 09/16/2004 FINDINGS: Lower chest: The lung bases are clear. Hepatobiliary: No focal liver abnormality is seen. No gallstones, gallbladder wall thickening, or biliary dilatation. Pancreas: No ductal dilatation or inflammation. Spleen: Normal in size without focal abnormality. Adrenals/Urinary Tract: Normal adrenal glands. Kidneys appear normal. No hydronephrosis or perinephric edema. Ureters are decompressed. Urinary bladder is physiologically distended without wall thickening. Stomach/Bowel: Normal air-filled appendix without periappendiceal inflammation. The stomach, small and large bowel appear normal. Small to moderate stool burden without colonic wall thickening. No liquid stool. Vascular/Lymphatic: No significant vascular findings are present. No  enlarged abdominal or pelvic lymph nodes. Reproductive: Prostate is unremarkable. Other: No free air, free fluid, or intra-abdominal fluid collection. Musculoskeletal: There are no acute or suspicious osseous abnormalities. IMPRESSION: Unremarkable CT of the abdomen/pelvis, no explanation for right lower quadrant pain. Specifically, normal appendix. Electronically Signed   By: Rubye Oaks M.D.   On: 02/20/2017 16:21    ____________________________________________   PROCEDURES Procedures  ____________________________________________   INITIAL IMPRESSION / ASSESSMENT AND PLAN / ED COURSE  Pertinent labs & imaging results that were available during my care of the patient were reviewed by me and considered in my medical decision making (see chart for details).  Patient presents with symptoms and exam concerning for appendicitis. Low suspicion for perforation obstruction biliary disease torsion hernia ureteral colic AAA or dissection. Patient is not septic and has normal vital signs. We'll give IV fluids for hydration and morphine for pain control while awaiting a CT scan.   ----------------------------------------- 5:08 PM on 02/20/2017 -----------------------------------------  CT normal. Vital signs remained normal. Workup unremarkable. Patient feeling well. Urinating well. We'll discharge home, counseled on possible early appendicitis although with 2 days of symptoms, we really should be seeing  some changes on the CT scan if this were due to the appendix. Low suspicion for torsion or scrotal hernia. Patient is in good condition and suitable for outpatient follow-up with primary care.     ____________________________________________   FINAL CLINICAL IMPRESSION(S) / ED DIAGNOSES  Final diagnoses:  Right lower quadrant abdominal pain      New Prescriptions   No medications on file     Portions of this note were generated with dragon dictation software. Dictation errors  may occur despite best attempts at proofreading.    Sharman Cheek, MD 02/20/17 [phone]

## 2018-07-28 IMAGING — CT CT ABD-PELV W/ CM
2 of 3 series · 16 of 46 positions shown, 18 images · IV contrast (APPLIED)
Comparison: CT 09/16/2004

CLINICAL DATA: Right lower quadrant pain and tenderness for 2 days.
Nausea and diarrhea.

EXAM:
CT ABDOMEN AND PELVIS WITH CONTRAST
TECHNIQUE: Multidetector CT imaging of the abdomen and pelvis was performed
using the standard protocol following bolus administration of
intravenous contrast.
CONTRAST:  100mL [id number] IOPAMIDOL (MYN5S2-9YY) INJECTION 61%

[Series 2: routine abd/pel with · axial · 0.72mm/px · z∈[-1073,-663]mm · 13 of 96 slices shown, 15 images]
[im 7/96  soft-tissue]
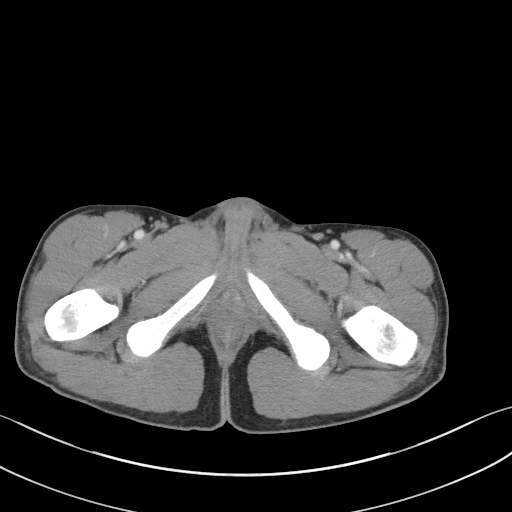
[im 7/96  bone]
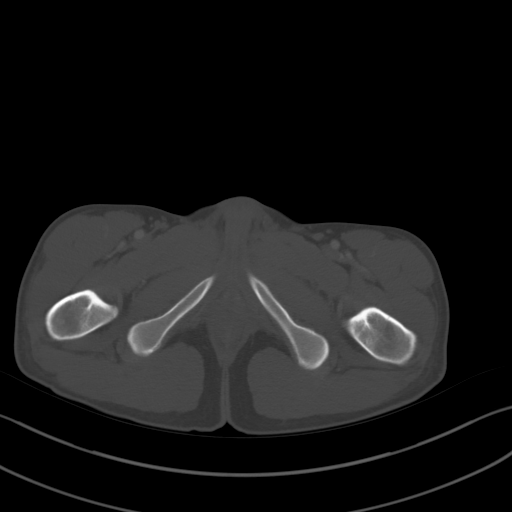
[im 13/96  soft-tissue]
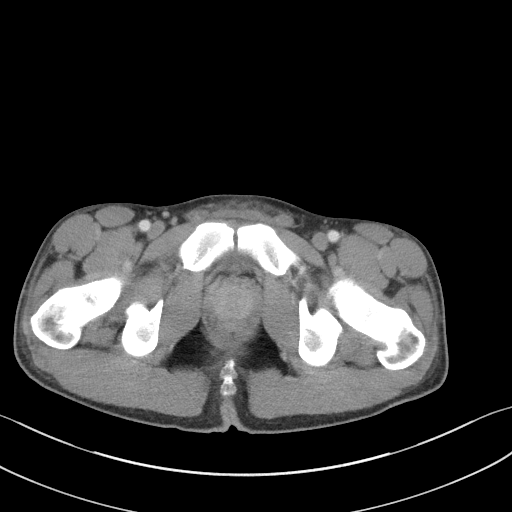
[im 19/96  soft-tissue]
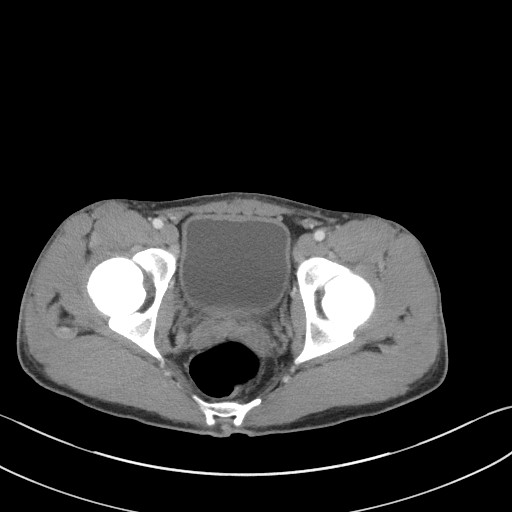
[im 28/96  soft-tissue]
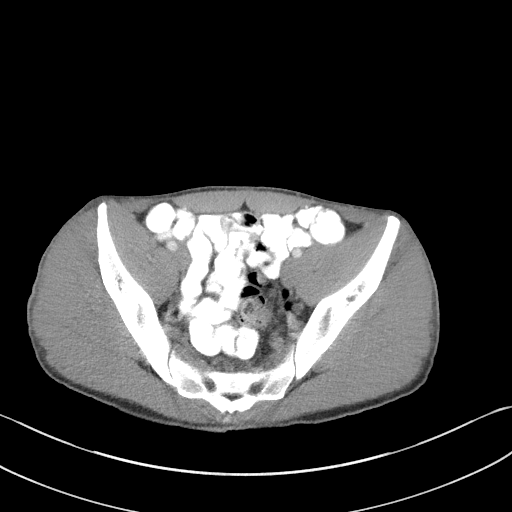
[im 34/96  soft-tissue]
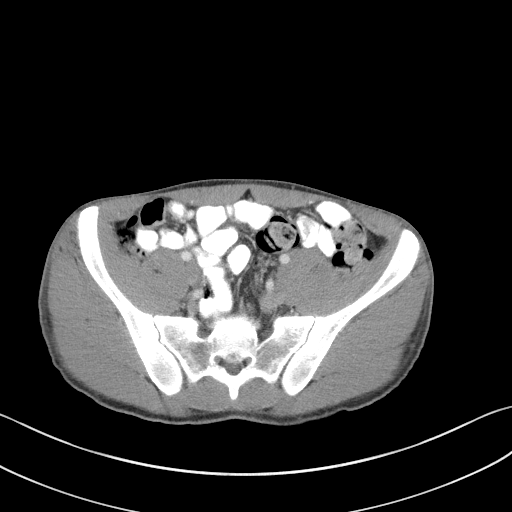
[im 40/96  soft-tissue]
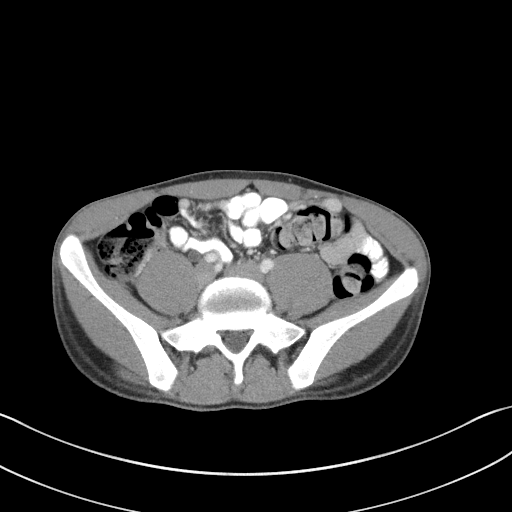
[im 50/96  soft-tissue]
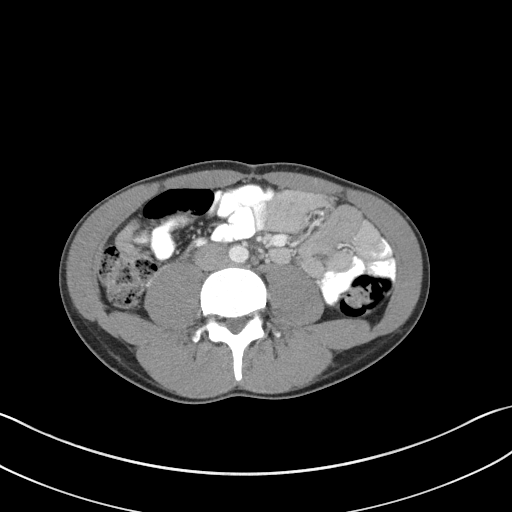
[im 56/96  soft-tissue]
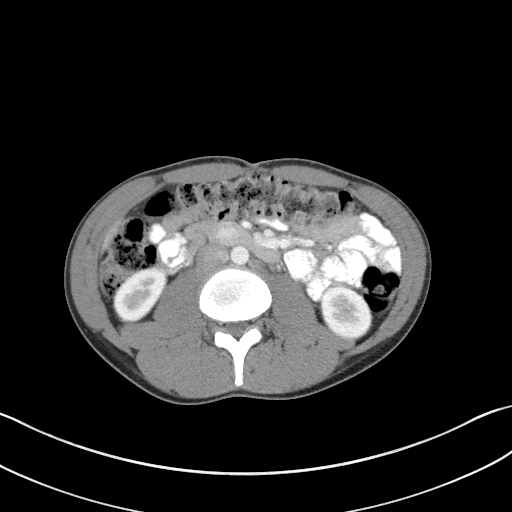
[im 62/96  soft-tissue]
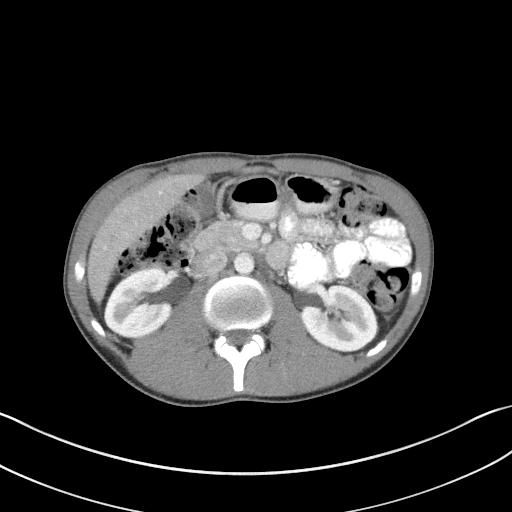
[im 62/96  bone]
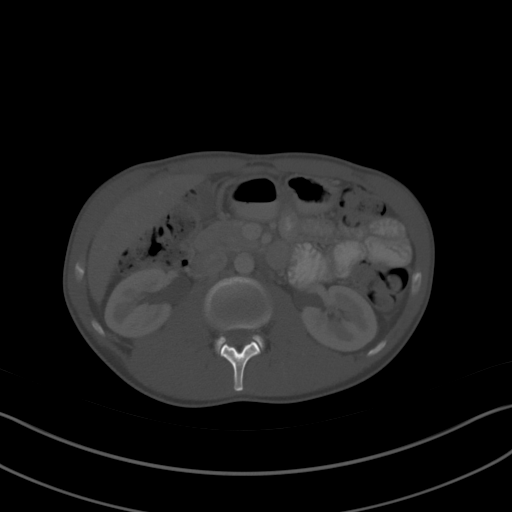
[im 68/96  soft-tissue]
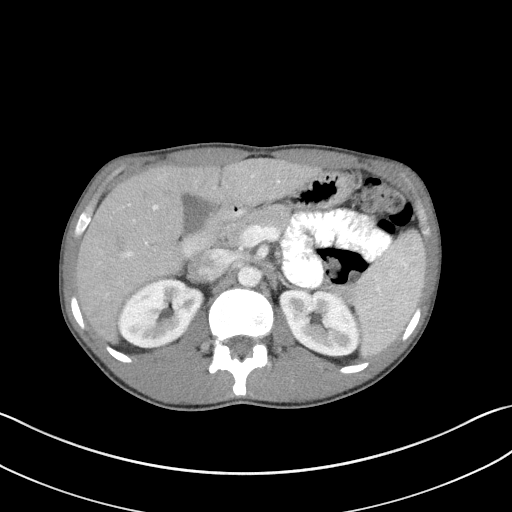
[im 77/96  soft-tissue]
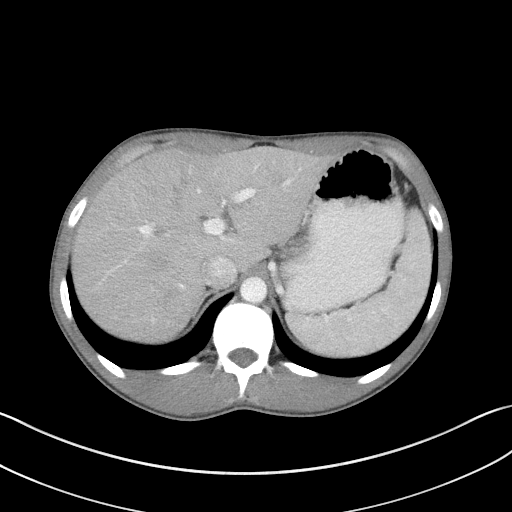
[im 83/96  soft-tissue]
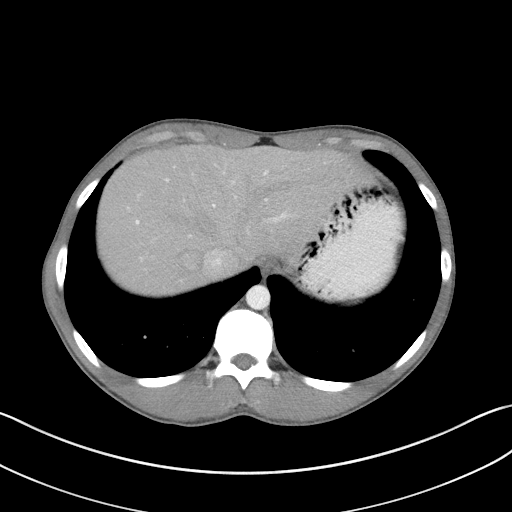
[im 89/96  soft-tissue]
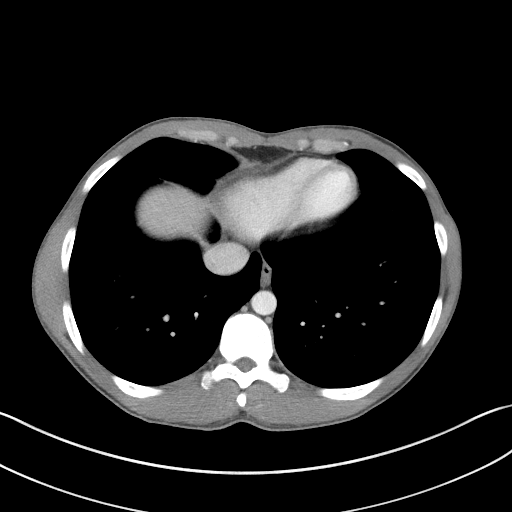

[Series 5: coronal st · coronal · 0.68mm/px · 3 of 85 slices shown]
[im 29/85  soft-tissue]
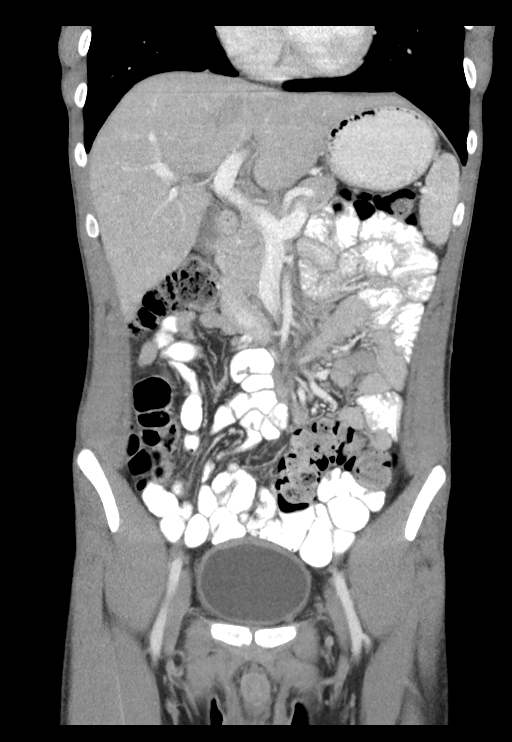
[im 38/85  soft-tissue]
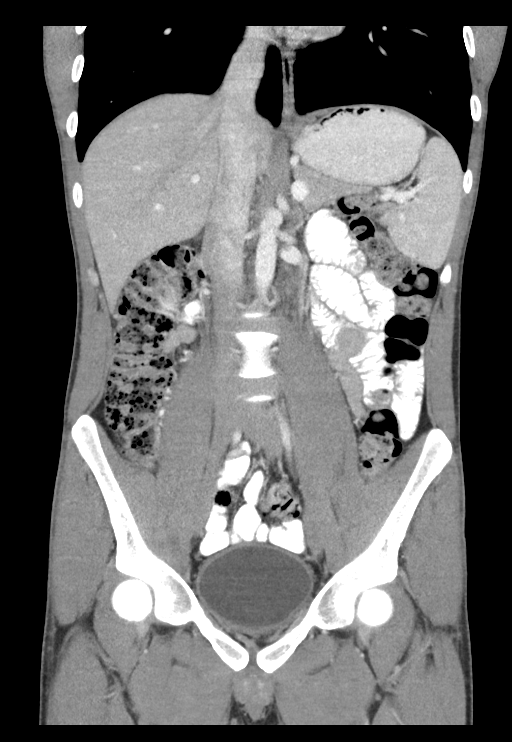
[im 47/85  soft-tissue]
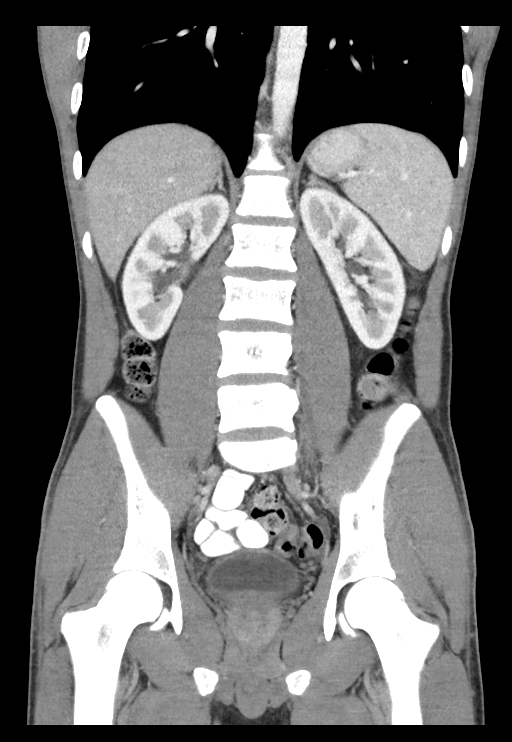

[16 of 46 positions shown; findings below may reference images not displayed]

FINDINGS: Lower chest: The lung bases are clear.

Hepatobiliary: No focal liver abnormality is seen. No gallstones,
gallbladder wall thickening, or biliary dilatation.

Pancreas: No ductal dilatation or inflammation.

Spleen: Normal in size without focal abnormality.

Adrenals/Urinary Tract: Normal adrenal glands. Kidneys appear
normal. No hydronephrosis or perinephric edema. Ureters are
decompressed. Urinary bladder is physiologically distended without
wall thickening.

Stomach/Bowel: Normal air-filled appendix without periappendiceal
inflammation. The stomach, small and large bowel appear normal.
Small to moderate stool burden without colonic wall thickening. No
liquid stool.

Vascular/Lymphatic: No significant vascular findings are present. No
enlarged abdominal or pelvic lymph nodes.

Reproductive: Prostate is unremarkable.

Other: No free air, free fluid, or intra-abdominal fluid collection.

Musculoskeletal: There are no acute or suspicious osseous
abnormalities.
IMPRESSION: Unremarkable CT of the abdomen/pelvis, no explanation for right
lower quadrant pain. Specifically, normal appendix.

## 2019-06-13 ENCOUNTER — Ambulatory Visit
Admission: EM | Admit: 2019-06-13 | Discharge: 2019-06-13 | Disposition: A | Discharge status: Home / Self Care | Payer: BLUE CROSS/BLUE SHIELD | Attending: Urgent Care | Admitting: Urgent Care

## 2019-06-13 ENCOUNTER — Other Ambulatory Visit: Payer: Self-pay

## 2019-06-13 ENCOUNTER — Encounter: Payer: Self-pay | Admitting: Emergency Medicine

## 2019-06-13 DIAGNOSIS — T23031A Burn of unspecified degree of multiple right fingers (nail), not including thumb, initial encounter: Secondary | ICD-10-CM

## 2019-06-13 DIAGNOSIS — T23001A Burn of unspecified degree of right hand, unspecified site, initial encounter: Secondary | ICD-10-CM | POA: Diagnosis not present

## 2019-06-13 DIAGNOSIS — X12XXXA Contact with other hot fluids, initial encounter: Secondary | ICD-10-CM

## 2019-06-13 MED ORDER — SILVER SULFADIAZINE 1 % EX CREA: 1.0000 "application " | TOPICAL_CREAM | Freq: Two times a day (BID) | CUTANEOUS | 0 refills | Status: DC

## 2019-06-13 NOTE — Discharge Instructions (Addendum)
It was very nice seeing you today in clinic. Thank you for entrusting me with your care.   Keep hand clean and dry. Apply antibiotic ointment liberally TWICE a day for the next 5-7 days. May use Tylenol and/or Ibuprofen as needed for pain/fever.   Make arrangements to follow up with your regular doctor in 1 week for re-evaluation if not improving. If your symptoms/condition worsens, please seek follow up care either here or in the ER. Please remember, our [facility] providers are "right here with you" when you need [place].   Again, it was my pleasure to take care of you today. Thank you for choosing our clinic. I hope that you start to feel better quickly.   Honor Loh, MSN, APRN, FNP-C, CEN Advanced Practice Provider [facility]
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
## 2019-09-14 ENCOUNTER — Encounter: Payer: Self-pay | Admitting: Emergency Medicine

## 2019-09-14 ENCOUNTER — Other Ambulatory Visit: Payer: Self-pay

## 2019-09-14 ENCOUNTER — Ambulatory Visit
Admission: EM | Admit: 2019-09-14 | Discharge: 2019-09-14 | Disposition: A | Discharge status: Home / Self Care | Payer: PRIVATE HEALTH INSURANCE | Attending: Family Medicine | Admitting: Family Medicine

## 2019-09-14 DIAGNOSIS — M545 Low back pain, unspecified: Secondary | ICD-10-CM

## 2019-09-14 MED ORDER — PREDNISONE 10 MG PO TABS: ORAL_TABLET | ORAL | 0 refills | Status: AC

## 2019-09-14 MED ORDER — CYCLOBENZAPRINE HCL 10 MG PO TABS: 10.0000 mg | ORAL_TABLET | Freq: Two times a day (BID) | ORAL | 0 refills | Status: AC | PRN
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
## 2019-09-14 NOTE — Discharge Instructions (Signed)
Take medication as prescribed. Rest. Drink plenty of fluids. Stretch. Ice. Heat.  ° °Follow up with your primary care physician this week as needed. Return to Urgent care for new or worsening concerns.  ° °

## 2020-09-21 ENCOUNTER — Ambulatory Visit: Payer: Self-pay | Admitting: Physician Assistant

## 2023-01-10 DIAGNOSIS — F4011 Social phobia, generalized: Secondary | ICD-10-CM | POA: Diagnosis not present

## 2023-01-10 DIAGNOSIS — F3181 Bipolar II disorder: Secondary | ICD-10-CM | POA: Diagnosis not present

## 2023-01-10 DIAGNOSIS — F17290 Nicotine dependence, other tobacco product, uncomplicated: Secondary | ICD-10-CM | POA: Diagnosis not present

## 2023-01-10 DIAGNOSIS — F902 Attention-deficit hyperactivity disorder, combined type: Secondary | ICD-10-CM | POA: Diagnosis not present

## 2023-01-10 DIAGNOSIS — F1220 Cannabis dependence, uncomplicated: Secondary | ICD-10-CM | POA: Diagnosis not present

## 2023-02-21 DIAGNOSIS — F1220 Cannabis dependence, uncomplicated: Secondary | ICD-10-CM | POA: Diagnosis not present

## 2023-02-21 DIAGNOSIS — F3181 Bipolar II disorder: Secondary | ICD-10-CM | POA: Diagnosis not present

## 2023-02-21 DIAGNOSIS — F17290 Nicotine dependence, other tobacco product, uncomplicated: Secondary | ICD-10-CM | POA: Diagnosis not present

## 2023-02-21 DIAGNOSIS — F4011 Social phobia, generalized: Secondary | ICD-10-CM | POA: Diagnosis not present

## 2023-02-21 DIAGNOSIS — F902 Attention-deficit hyperactivity disorder, combined type: Secondary | ICD-10-CM | POA: Diagnosis not present

## 2023-03-28 DIAGNOSIS — F3181 Bipolar II disorder: Secondary | ICD-10-CM | POA: Diagnosis not present

## 2023-03-28 DIAGNOSIS — F4011 Social phobia, generalized: Secondary | ICD-10-CM | POA: Diagnosis not present

## 2023-03-28 DIAGNOSIS — F902 Attention-deficit hyperactivity disorder, combined type: Secondary | ICD-10-CM | POA: Diagnosis not present

## 2023-03-28 DIAGNOSIS — F1220 Cannabis dependence, uncomplicated: Secondary | ICD-10-CM | POA: Diagnosis not present

## 2023-03-28 DIAGNOSIS — F17290 Nicotine dependence, other tobacco product, uncomplicated: Secondary | ICD-10-CM | POA: Diagnosis not present

## 2023-06-13 DIAGNOSIS — F1220 Cannabis dependence, uncomplicated: Secondary | ICD-10-CM | POA: Diagnosis not present

## 2023-06-13 DIAGNOSIS — F4011 Social phobia, generalized: Secondary | ICD-10-CM | POA: Diagnosis not present

## 2023-06-13 DIAGNOSIS — F17290 Nicotine dependence, other tobacco product, uncomplicated: Secondary | ICD-10-CM | POA: Diagnosis not present

## 2023-06-13 DIAGNOSIS — F902 Attention-deficit hyperactivity disorder, combined type: Secondary | ICD-10-CM | POA: Diagnosis not present

## 2023-06-13 DIAGNOSIS — F3181 Bipolar II disorder: Secondary | ICD-10-CM | POA: Diagnosis not present

## 2023-07-25 DIAGNOSIS — F4011 Social phobia, generalized: Secondary | ICD-10-CM | POA: Diagnosis not present

## 2023-07-25 DIAGNOSIS — F17290 Nicotine dependence, other tobacco product, uncomplicated: Secondary | ICD-10-CM | POA: Diagnosis not present

## 2023-07-25 DIAGNOSIS — F1220 Cannabis dependence, uncomplicated: Secondary | ICD-10-CM | POA: Diagnosis not present

## 2023-07-25 DIAGNOSIS — F3181 Bipolar II disorder: Secondary | ICD-10-CM | POA: Diagnosis not present

## 2023-07-25 DIAGNOSIS — F902 Attention-deficit hyperactivity disorder, combined type: Secondary | ICD-10-CM | POA: Diagnosis not present

## 2023-09-12 DIAGNOSIS — F1220 Cannabis dependence, uncomplicated: Secondary | ICD-10-CM | POA: Diagnosis not present

## 2023-09-12 DIAGNOSIS — F17290 Nicotine dependence, other tobacco product, uncomplicated: Secondary | ICD-10-CM | POA: Diagnosis not present

## 2023-09-12 DIAGNOSIS — F902 Attention-deficit hyperactivity disorder, combined type: Secondary | ICD-10-CM | POA: Diagnosis not present

## 2023-09-12 DIAGNOSIS — F3181 Bipolar II disorder: Secondary | ICD-10-CM | POA: Diagnosis not present

## 2023-09-12 DIAGNOSIS — F4011 Social phobia, generalized: Secondary | ICD-10-CM | POA: Diagnosis not present

## 2023-09-30 ENCOUNTER — Encounter: Payer: Self-pay | Admitting: Emergency Medicine

## 2023-09-30 ENCOUNTER — Other Ambulatory Visit: Payer: Self-pay

## 2023-09-30 DIAGNOSIS — R1011 Right upper quadrant pain: Secondary | ICD-10-CM | POA: Diagnosis not present

## 2023-09-30 DIAGNOSIS — R110 Nausea: Secondary | ICD-10-CM | POA: Diagnosis not present

## 2023-09-30 DIAGNOSIS — R1013 Epigastric pain: Secondary | ICD-10-CM | POA: Diagnosis not present

## 2023-09-30 DIAGNOSIS — D72829 Elevated white blood cell count, unspecified: Secondary | ICD-10-CM | POA: Insufficient documentation

## 2023-09-30 NOTE — ED Triage Notes (Signed)
Pt states symptoms started last Friday 2/7, was seen at PCP on Tuesday and given 1L fluids. Pt states symptoms have continued. Pt c/o generalized abd pain, nausea, and decreased PO intake. Pt states increased pain and nausea with eating.

## 2023-10-01 ENCOUNTER — Emergency Department
Admission: EM | Admit: 2023-10-01 | Discharge: 2023-10-01 | Disposition: A | Discharge status: Home / Self Care | Payer: PRIVATE HEALTH INSURANCE | Attending: Emergency Medicine | Admitting: Emergency Medicine

## 2023-10-01 ENCOUNTER — Emergency Department: Payer: 59

## 2023-10-01 DIAGNOSIS — R1010 Upper abdominal pain, unspecified: Secondary | ICD-10-CM

## 2023-10-01 LAB — COMPREHENSIVE METABOLIC PANEL
ALT: 15 U/L (ref 0–44)
AST: 20 U/L (ref 15–41)
Albumin: 4.8 g/dL (ref 3.5–5.0)
Alkaline Phosphatase: 64 U/L (ref 38–126)
Anion gap: 14 (ref 5–15)
BUN: 26 mg/dL — ABNORMAL HIGH (ref 6–20)
CO2: 27 mmol/L (ref 22–32)
Calcium: 9.9 mg/dL (ref 8.9–10.3)
Chloride: 98 mmol/L (ref 98–111)
Creatinine, Ser: 0.93 mg/dL (ref 0.61–1.24)
GFR, Estimated: >60 mL/min (ref >60)
Glucose, Bld: 126 mg/dL — ABNORMAL HIGH (ref 70–99)
Potassium: 3.8 mmol/L (ref 3.5–5.1)
Sodium: 139 mmol/L (ref 135–145)
Total Bilirubin: 1.4 mg/dL — ABNORMAL HIGH (ref 0.0–1.2)
Total Protein: 7.7 g/dL (ref 6.5–8.1)

## 2023-10-01 LAB — CBC
HCT: 47.5 % (ref 39.0–52.0)
Hemoglobin: 17.1 g/dL — ABNORMAL HIGH (ref 13.0–17.0)
MCH: 32 pg (ref 26.0–34.0)
MCHC: 36 g/dL (ref 30.0–36.0)
MCV: 88.8 fL (ref 80.0–100.0)
Platelets: 326 10*3/uL (ref 150–400)
RBC: 5.35 MIL/uL (ref 4.22–5.81)
RDW: 11.5 % (ref 11.5–15.5)
WBC: 15.4 10*3/uL — ABNORMAL HIGH (ref 4.0–10.5)
nRBC: 0 % (ref 0.0–0.2)

## 2023-10-01 LAB — LIPASE, BLOOD: Lipase: 27 U/L (ref 11–51)

## 2023-10-01 MED ORDER — MORPHINE SULFATE (PF) 4 MG/ML IV SOLN
4.0000 mg | Freq: Once | INTRAVENOUS | Status: AC
  Administered 2023-10-01: 4 mg via INTRAVENOUS
  Filled 2023-10-01: qty 1

## 2023-10-01 MED ORDER — PANTOPRAZOLE SODIUM 40 MG PO TBEC: 40.0000 mg | DELAYED_RELEASE_TABLET | Freq: Every day | ORAL | 0 refills | Status: AC

## 2023-10-01 MED ORDER — PANTOPRAZOLE SODIUM 40 MG IV SOLR
40.0000 mg | Freq: Once | INTRAVENOUS | Status: AC
  Administered 2023-10-01: 40 mg via INTRAVENOUS
  Filled 2023-10-01: qty 10

## 2023-10-01 MED ORDER — DICYCLOMINE HCL 20 MG PO TABS: 20.0000 mg | ORAL_TABLET | Freq: Three times a day (TID) | ORAL | 0 refills | Status: AC | PRN

## 2023-10-01 MED ORDER — ONDANSETRON 4 MG PO TBDP: 4.0000 mg | ORAL_TABLET | Freq: Four times a day (QID) | ORAL | 0 refills | Status: AC | PRN

## 2023-10-01 MED ORDER — ONDANSETRON HCL 4 MG/2ML IJ SOLN
4.0000 mg | Freq: Once | INTRAMUSCULAR | Status: AC
  Administered 2023-10-01: 4 mg via INTRAVENOUS
  Filled 2023-10-01: qty 2

## 2023-10-01 MED ORDER — SODIUM CHLORIDE 0.9 % IV BOLUS (SEPSIS)
1000.0000 mL | Freq: Once | INTRAVENOUS | Status: AC
  Administered 2023-10-01: 1000 mL via INTRAVENOUS

## 2023-10-01 NOTE — ED Provider Notes (Signed)
[facility] Provider Note    Event Date/Time   First MD Initiated Contact with Patient 10/01/23 [phone]     (approximate)   History   Abdominal Pain   HPI  COBIE MARCOUX is a 33 y.o. male with history of migraines, ADD, bipolar disorder, previous inguinal hernia repair who presents to the emergency department complaints of upper abdominal pain.  States that a week ago he had vomiting, diarrhea and upper abdominal pain.  Reports the vomiting, diarrhea have resolved over the past couple of days but he is still having severe upper abdominal pain.  He denies any fevers.  No dysuria or hematuria.  No tenderness in the right lower quadrant.  Has been taking Tylenol, Pepto-Bismol without relief.  Reports he did see his primary care doctor this week for the symptoms and was told he likely had viral gastroenteritis versus food poisoning.  He was given a liter of saline.  States he has been taking antiemetics at home.  History provided by patient.    Past Medical History:  Diagnosis Date   ADD (attention deficit disorder)    Migraines     Past Surgical History:  Procedure Laterality Date   HERNIA REPAIR      MEDICATIONS:  Prior to Admission medications   Medication Sig Start Date End Date Taking? Authorizing Provider  cyclobenzaprine (FLEXERIL) 10 MG tablet Take 1 tablet (10 mg total) by mouth 2 (two) times daily as needed for muscle spasms. Do not drive while taking as can cause drowsiness 09/14/19   Renford Dills, NP  predniSONE (DELTASONE) 10 MG tablet Start 60 mg po day one, then 50 mg po day two, taper by 10 mg daily until complete. 09/14/19   Renford Dills, NP  amphetamine-dextroamphetamine (ADDERALL) 10 MG tablet Take 10 mg by mouth daily with breakfast.  06/13/19  [provider]  citalopram (CELEXA) 20 MG tablet Take 1 tablet (20 mg total) by mouth daily. 04/15/15 06/13/19  Clapacs, Jackquline Denmark, MD    Physical Exam   Triage Vital Signs: ED  Triage Vitals  Encounter Vitals Group     BP 09/30/23 2358 (!) 135/102     Systolic BP Percentile --      Diastolic BP Percentile --      Pulse Rate 09/30/23 2358 80     Resp 09/30/23 2358 17     Temp 09/30/23 2358 97.8 F (36.6 C)     Temp Source 09/30/23 2358 Oral     SpO2 09/30/23 2358 97 %     Weight 09/30/23 2355 135 lb (61.2 kg)     Height 09/30/23 2355 5\' 11"  (1.803 m)     Head Circumference --      Peak Flow --      Pain Score 09/30/23 2355 7     Pain Loc --      Pain Education --      Exclude from Growth Chart --     Most recent vital signs: Vitals:   10/01/23 0200 10/01/23 0230  BP: 126/87 121/88  Pulse: (!) 55 (!) 56  Resp:  20  Temp:    SpO2: 98% 99%    CONSTITUTIONAL: Alert, responds appropriately to questions. Well-appearing; well-nourished HEAD: Normocephalic, atraumatic EYES: Conjunctivae clear, pupils appear equal, sclera nonicteric ENT: normal nose; dry mucous membranes NECK: Supple, normal ROM CARD: RRR; S1 and S2 appreciated RESP: Normal chest excursion without splinting or tachypnea; breath sounds clear and equal bilaterally; no wheezes, no rhonchi,  no rales, no hypoxia or respiratory distress, speaking full sentences ABD/GI: Non-distended; soft, tender in the right upper quadrant and epigastric region, positive Murphy sign, no tenderness at McBurney's point BACK: The back appears normal EXT: Normal ROM in all joints; no deformity noted, no edema SKIN: Normal color for age and race; warm; no rash on exposed skin NEURO: Moves all extremities equally, normal speech PSYCH: The patient's mood and manner are appropriate.   ED Results / Procedures / Treatments   LABS: (all labs ordered are listed, but only abnormal results are displayed) Labs Reviewed  COMPREHENSIVE METABOLIC PANEL - Abnormal; Notable for the following components:      Result Value   Glucose, Bld 126 (*)    BUN 26 (*)    Total Bilirubin 1.4 (*)    All other components within  normal limits  CBC - Abnormal; Notable for the following components:   WBC 15.4 (*)    Hemoglobin 17.1 (*)    All other components within normal limits  LIPASE, BLOOD     EKG:   RADIOLOGY: My personal review and interpretation of imaging: Ultrasound shows no acute abnormality.  I have personally reviewed all radiology reports.   US ABDOMEN LIMITED RUQ (LIVER/GB) Result Date: 10/01/2023 CLINICAL DATA:  Upper abdominal pain EXAM: ULTRASOUND ABDOMEN LIMITED RIGHT UPPER QUADRANT COMPARISON:  CT from 02/20/2017 FINDINGS: Gallbladder: No gallstones or wall thickening visualized. No sonographic Murphy sign noted by sonographer. Common bile duct: Diameter: 2.6 mm Liver: No focal lesion identified. Within normal limits in parenchymal echogenicity. Portal vein is patent on color Doppler imaging with normal direction of blood flow towards the liver. Other: None. IMPRESSION: No acute abnormality noted. Electronically Signed   By: Alcide Clever M.D.   On: 10/01/2023 01:57     PROCEDURES:  Critical Care performed: No     Procedures    IMPRESSION / MDM / ASSESSMENT AND PLAN / ED COURSE  I reviewed the triage vital signs and the nursing notes.    Patient here with upper abdominal pain that continues after vomiting and diarrhea have resolved.     DIFFERENTIAL DIAGNOSIS (includes but not limited to):   Viral gastroenteritis, gastritis, GERD, cholelithiasis, cholecystitis, pancreatitis, colitis, doubt appendicitis   Patient's presentation is most consistent with acute presentation with potential threat to life or bodily function.   PLAN: Labs obtained from triage show leukocytosis of 15,000.  Hemoglobin is elevated at 17.1 likely from hemoconcentration.  LFTs normal other than minimally elevated total bilirubin of 1.4.  Normal lipase.  Will obtain right upper quadrant ultrasound as he does have a positive Murphy sign on my exam.  Will give IV fluids, Protonix, pain and nausea  medicine.   MEDICATIONS GIVEN IN ED: Medications  sodium chloride 0.9 % bolus 1,000 mL (0 mLs Intravenous Stopped 10/01/23 0238)  morphine (PF) 4 MG/ML injection 4 mg (4 mg Intravenous Given 10/01/23 0140)  ondansetron (ZOFRAN) injection 4 mg (4 mg Intravenous Given 10/01/23 0141)  pantoprazole (PROTONIX) injection 40 mg (40 mg Intravenous Given 10/01/23 0141)     ED COURSE: Ultrasound reviewed and interpreted by myself and the radiologist and shows no acute abnormality.  Patient reports feeling much better.  Discussed obtaining CT of the abdomen pelvis but patient is okay with holding off at this time.  I suspect that his symptoms are related to gastritis from recent viral gastroenteritis.  Will discharge with additional nausea medicine, Bentyl, Protonix.  Recommended a bland diet.  Discussed supportive care instructions, return  precautions.   At this time, I do not feel there is any life-threatening condition present. I reviewed all nursing notes, vitals, pertinent previous records.  All lab and urine results, EKGs, imaging ordered have been independently reviewed and interpreted by myself.  I reviewed all available radiology reports from any imaging ordered this visit.  Based on my assessment, I feel the patient is safe to be discharged home without further emergent workup and can continue workup as an outpatient as needed. Discussed all findings, treatment plan as well as usual and customary return precautions.  They verbalize understanding and are comfortable with this plan.  Outpatient follow-up has been provided as needed.  All questions have been answered.   CONSULTS:  none   OUTSIDE RECORDS REVIEWED: Reviewed last internal medicine note at [facility] and August 2017.       FINAL CLINICAL IMPRESSION(S) / ED DIAGNOSES   Final diagnoses:  Upper abdominal pain     Rx / DC Orders   ED Discharge Orders          Ordered    dicyclomine (BENTYL) 20 MG tablet  Every 8 hours PRN         10/01/23 0344    ondansetron (ZOFRAN-ODT) 4 MG disintegrating tablet  Every 6 hours PRN        10/01/23 0344    pantoprazole (PROTONIX) 40 MG tablet  Daily        10/01/23 0344             Note:  This document was prepared using Dragon voice recognition software and may include unintentional dictation errors.   Evangaline Jou, Layla Maw, DO 10/01/23 [phone]

## 2023-10-01 NOTE — Discharge Instructions (Signed)
Please avoid NSAIDs such as aspirin (Goody powders), ibuprofen (Motrin, Advil), naproxen (Aleve) as these may worsen your symptoms.  Tylenol 1000 mg every 6 hours is safe to take as long as you have no history of liver problems (heavy alcohol use, cirrhosis, hepatitis).  Please avoid spicy, acidic (citrus fruits, tomato based sauces, salsa), greasy, fatty foods.  Please avoid caffeine and alcohol.  Smoking can also make GERD/acid reflux worse.  Over the counter medications such as TUMS, Maalox or Mylanta, pepcid, Prilosec or Nexium may help with your symptoms.  Do not take Prilosec or Nexium if you are already prescribed a proton pump inhibitor.

## 2023-12-05 DIAGNOSIS — F4011 Social phobia, generalized: Secondary | ICD-10-CM | POA: Diagnosis not present

## 2023-12-05 DIAGNOSIS — F1220 Cannabis dependence, uncomplicated: Secondary | ICD-10-CM | POA: Diagnosis not present

## 2023-12-05 DIAGNOSIS — F902 Attention-deficit hyperactivity disorder, combined type: Secondary | ICD-10-CM | POA: Diagnosis not present

## 2023-12-05 DIAGNOSIS — F3181 Bipolar II disorder: Secondary | ICD-10-CM | POA: Diagnosis not present

## 2023-12-05 DIAGNOSIS — F17290 Nicotine dependence, other tobacco product, uncomplicated: Secondary | ICD-10-CM | POA: Diagnosis not present

## 2024-05-23 DIAGNOSIS — F17290 Nicotine dependence, other tobacco product, uncomplicated: Secondary | ICD-10-CM | POA: Diagnosis not present

## 2024-05-23 DIAGNOSIS — F902 Attention-deficit hyperactivity disorder, combined type: Secondary | ICD-10-CM | POA: Diagnosis not present

## 2024-05-23 DIAGNOSIS — F3181 Bipolar II disorder: Secondary | ICD-10-CM | POA: Diagnosis not present

## 2024-05-23 DIAGNOSIS — F4011 Social phobia, generalized: Secondary | ICD-10-CM | POA: Diagnosis not present

## 2024-05-23 DIAGNOSIS — F1220 Cannabis dependence, uncomplicated: Secondary | ICD-10-CM | POA: Diagnosis not present
